# Patient Record
Sex: Female | Born: 1950
Health system: Southern US, Community
[De-identification: ages and names within clinical notes are randomized; demographics above are authoritative.]

## PROBLEM LIST (undated history)

## (undated) DIAGNOSIS — E785 Hyperlipidemia, unspecified: Secondary | ICD-10-CM

## (undated) DIAGNOSIS — H269 Unspecified cataract: Secondary | ICD-10-CM

## (undated) HISTORY — DX: Hyperlipidemia, unspecified: E78.5

## (undated) HISTORY — DX: Unspecified cataract: H26.9

## (undated) HISTORY — PX: CATARACT EXTRACTION: SUR2

---

## 2007-07-17 ENCOUNTER — Emergency Department (HOSPITAL_COMMUNITY): Admission: EM | Admit: 2007-07-17 | Discharge: 2007-07-17 | Payer: Self-pay | Admitting: Emergency Medicine

## 2007-12-22 ENCOUNTER — Encounter: Admission: RE | Admit: 2007-12-22 | Discharge: 2007-12-22 | Payer: Self-pay | Admitting: Internal Medicine

## 2008-01-01 ENCOUNTER — Ambulatory Visit: Payer: Self-pay | Admitting: Internal Medicine

## 2008-03-19 ENCOUNTER — Ambulatory Visit: Payer: Self-pay | Admitting: Internal Medicine

## 2008-03-26 ENCOUNTER — Encounter: Payer: Self-pay | Admitting: Internal Medicine

## 2008-03-26 ENCOUNTER — Ambulatory Visit: Payer: Self-pay | Admitting: Internal Medicine

## 2008-03-29 ENCOUNTER — Encounter: Payer: Self-pay | Admitting: Internal Medicine

## 2008-12-22 ENCOUNTER — Encounter: Admission: RE | Admit: 2008-12-22 | Discharge: 2008-12-22 | Payer: Self-pay | Admitting: Internal Medicine

## 2009-12-28 ENCOUNTER — Encounter: Admission: RE | Admit: 2009-12-28 | Discharge: 2009-12-28 | Payer: Self-pay | Admitting: Internal Medicine

## 2010-04-05 ENCOUNTER — Encounter: Admission: RE | Admit: 2010-04-05 | Discharge: 2010-04-05 | Payer: Self-pay | Admitting: Internal Medicine

## 2010-11-05 ENCOUNTER — Encounter: Payer: Self-pay | Admitting: Internal Medicine

## 2010-12-15 ENCOUNTER — Other Ambulatory Visit: Payer: Self-pay | Admitting: Internal Medicine

## 2010-12-15 DIAGNOSIS — Z1231 Encounter for screening mammogram for malignant neoplasm of breast: Secondary | ICD-10-CM

## 2011-01-04 ENCOUNTER — Ambulatory Visit
Admission: RE | Admit: 2011-01-04 | Discharge: 2011-01-04 | Disposition: A | Discharge status: Home / Self Care | Payer: 59 | Source: Ambulatory Visit | Attending: Internal Medicine | Admitting: Internal Medicine

## 2011-01-04 DIAGNOSIS — Z1231 Encounter for screening mammogram for malignant neoplasm of breast: Secondary | ICD-10-CM

## 2011-04-15 HISTORY — PX: CATARACT EXTRACTION: SUR2

## 2011-07-26 LAB — COMPREHENSIVE METABOLIC PANEL
AST: 22
Alkaline Phosphatase: 67
BUN: 12
CO2: 22
GFR calc non Af Amer: >60
Total Bilirubin: 0.6

## 2011-07-26 LAB — POCT CARDIAC MARKERS
CKMB, poc: <1 — ABNORMAL LOW
CKMB, poc: <1 — ABNORMAL LOW
Operator id: 277751
Operator id: 294501
Troponin i, poc: <0.05
Troponin i, poc: <0.05

## 2011-07-26 LAB — CULTURE, BLOOD (ROUTINE X 2)

## 2011-07-26 LAB — DIFFERENTIAL
Basophils Absolute: 0
Eosinophils Relative: 0
Lymphocytes Relative: 5 — ABNORMAL LOW
Monocytes Absolute: 0.2
Monocytes Relative: 3

## 2011-07-26 LAB — CBC
Hemoglobin: 14.5
RDW: 13.3
WBC: 8.1

## 2011-07-26 LAB — URINE MICROSCOPIC-ADD ON

## 2011-07-26 LAB — URINALYSIS, ROUTINE W REFLEX MICROSCOPIC
Bilirubin Urine: NEGATIVE
Glucose, UA: NEGATIVE
Ketones, ur: NEGATIVE
Protein, ur: NEGATIVE
Specific Gravity, Urine: 1.029

## 2011-07-26 LAB — URINE CULTURE
Colony Count: NO GROWTH
Culture: NO GROWTH

## 2011-11-27 ENCOUNTER — Other Ambulatory Visit: Payer: Self-pay | Admitting: Internal Medicine

## 2011-11-27 DIAGNOSIS — Z1231 Encounter for screening mammogram for malignant neoplasm of breast: Secondary | ICD-10-CM

## 2012-01-07 ENCOUNTER — Ambulatory Visit
Admission: RE | Admit: 2012-01-07 | Discharge: 2012-01-07 | Disposition: A | Discharge status: Home / Self Care | Payer: 59 | Source: Ambulatory Visit | Attending: Internal Medicine | Admitting: Internal Medicine

## 2012-01-07 DIAGNOSIS — Z1231 Encounter for screening mammogram for malignant neoplasm of breast: Secondary | ICD-10-CM

## 2012-04-09 DIAGNOSIS — F32A Depression, unspecified: Secondary | ICD-10-CM | POA: Insufficient documentation

## 2012-04-09 DIAGNOSIS — H5462 Unqualified visual loss, left eye, normal vision right eye: Secondary | ICD-10-CM | POA: Insufficient documentation

## 2012-04-09 DIAGNOSIS — E559 Vitamin D deficiency, unspecified: Secondary | ICD-10-CM | POA: Insufficient documentation

## 2012-04-09 DIAGNOSIS — E785 Hyperlipidemia, unspecified: Secondary | ICD-10-CM | POA: Insufficient documentation

## 2012-04-10 HISTORY — PX: OTHER SURGICAL HISTORY: SHX169

## 2012-06-11 DIAGNOSIS — E893 Postprocedural hypopituitarism: Secondary | ICD-10-CM | POA: Insufficient documentation

## 2012-12-11 ENCOUNTER — Other Ambulatory Visit: Payer: Self-pay

## 2012-12-11 DIAGNOSIS — Z1231 Encounter for screening mammogram for malignant neoplasm of breast: Secondary | ICD-10-CM

## 2013-01-13 ENCOUNTER — Ambulatory Visit
Admission: RE | Admit: 2013-01-13 | Discharge: 2013-01-13 | Disposition: A | Discharge status: Home / Self Care | Payer: 59 | Source: Ambulatory Visit

## 2013-01-13 DIAGNOSIS — Z1231 Encounter for screening mammogram for malignant neoplasm of breast: Secondary | ICD-10-CM

## 2013-03-17 ENCOUNTER — Encounter: Payer: Self-pay | Admitting: Internal Medicine

## 2013-04-13 ENCOUNTER — Ambulatory Visit (AMBULATORY_SURGERY_CENTER): Payer: 59 | Admitting: *Deleted

## 2013-04-13 ENCOUNTER — Encounter: Payer: Self-pay | Admitting: Internal Medicine

## 2013-04-13 VITALS — Ht 68.0 in | Wt 176.4 lb

## 2013-04-13 DIAGNOSIS — Z1211 Encounter for screening for malignant neoplasm of colon: Secondary | ICD-10-CM

## 2013-04-13 MED ORDER — MOVIPREP 100 G PO SOLR: ORAL | Status: DC

## 2013-04-29 DIAGNOSIS — D352 Benign neoplasm of pituitary gland: Secondary | ICD-10-CM | POA: Insufficient documentation

## 2013-05-05 ENCOUNTER — Ambulatory Visit (AMBULATORY_SURGERY_CENTER): Payer: 59 | Admitting: Internal Medicine

## 2013-05-05 ENCOUNTER — Encounter: Payer: Self-pay | Admitting: Internal Medicine

## 2013-05-05 VITALS — BP 130/87 | HR 69 | Temp 97.0°F | Resp 19 | Ht 68.0 in | Wt 176.0 lb

## 2013-05-05 DIAGNOSIS — Z86010 Personal history of colonic polyps: Secondary | ICD-10-CM

## 2013-05-05 DIAGNOSIS — D126 Benign neoplasm of colon, unspecified: Secondary | ICD-10-CM

## 2013-05-05 MED ORDER — SODIUM CHLORIDE 0.9 % IV SOLN: 500.0000 mL | INTRAVENOUS | Status: DC

## 2013-05-05 NOTE — Progress Notes (Signed)
Patient did not experience any of the following events: a burn prior to discharge; a fall within the facility; wrong site/side/patient/procedure/implant event; or a hospital transfer or hospital admission upon discharge from the facility. (G8907) Patient did not have preoperative order for IV antibiotic SSI prophylaxis. (G8918)  

## 2013-05-05 NOTE — Op Note (Signed)
[facility] [address] [place], [address]   COLONOSCOPY PROCEDURE REPORT  PATIENT: Erin Gregory, Erin Gregory  MR#: [id number] BIRTHDATE: 1950-12-02 , 62  yrs. old GENDER: Female ENDOSCOPIST: Roxy Cedar, MD REFERRED XB:[id number] Program Recall PROCEDURE DATE:  05/05/2013 PROCEDURE:   Colonoscopy with snare polypectomy x 1 ASA CLASS:   Class II INDICATIONS:Patient's personal history of adenomatous colon polyps. Index exam 03-2008 (small TAs) MEDICATIONS: MAC sedation, administered by CRNA and propofol (Diprivan) 250mg  IV  DESCRIPTION OF PROCEDURE:   After the risks benefits and alternatives of the procedure were thoroughly explained, informed consent was obtained.  A digital rectal exam revealed no abnormalities of the rectum.   The LB [id number]  endoscope was introduced through the anus and advanced to the cecum, which was identified by both the appendix and ileocecal valve. No adverse events experienced.   The quality of the prep was excellent, using MoviPrep  The instrument was then slowly withdrawn as the colon was fully examined.      COLON FINDINGS: A diminutive polyp was found in the ascending colon. A polypectomy was performed with a cold snare.  The resection was complete and the polyp tissue was completely retrieved.   Moderate diverticulosis was noted The finding was in the right colon and The finding was left colon.   The colon mucosa was otherwise normal. Retroflexed views revealed internal hemorrhoids. The time to cecum=1 minutes 47 seconds.  Withdrawal time=9 minutes 19 seconds. The scope was withdrawn and the procedure completed. COMPLICATIONS: There were no complications.  ENDOSCOPIC IMPRESSION: 1.   Diminutive polyp was found in the ascending colon; polypectomy was performed with a cold snare 2.   Moderate diverticulosis was noted in the right colon and left colon 3.   The colon mucosa was otherwise  normal  RECOMMENDATIONS: 1. Follow up colonoscopy in 5 years   eSigned:  Roxy Cedar, MD 05/05/2013 1:02 PM   cc: Kari Baars, MD and The Patient   PATIENT NAME:  Erin Gregory, Erin Gregory MR#: [id number]

## 2013-05-05 NOTE — Progress Notes (Signed)
A/ox3 pleased with MAC report to RN

## 2013-05-05 NOTE — Patient Instructions (Addendum)
YOU HAD AN ENDOSCOPIC PROCEDURE TODAY AT THE [facility]: Refer to the procedure report that was given to you for any specific questions about what was found during the examination.  If the procedure report does not answer your questions, please call your gastroenterologist to clarify.  If you requested that your care partner not be given the details of your procedure findings, then the procedure report has been included in a sealed envelope for you to review at your convenience later.  YOU SHOULD EXPECT: Some feelings of bloating in the abdomen. Passage of more gas than usual.  Walking can help get rid of the air that was put into your GI tract during the procedure and reduce the bloating. If you had a lower endoscopy (such as a colonoscopy or flexible sigmoidoscopy) you may notice spotting of blood in your stool or on the toilet paper. If you underwent a bowel prep for your procedure, then you may not have a normal bowel movement for a few days.  DIET: Your first meal following the procedure should be a light meal and then it is ok to progress to your normal diet.  A half-sandwich or bowl of soup is an example of a good first meal.  Heavy or fried foods are harder to digest and may make you feel nauseous or bloated.  Likewise meals heavy in dairy and vegetables can cause extra gas to form and this can also increase the bloating.  Drink plenty of fluids but you should avoid alcoholic beverages for 24 hours.  ACTIVITY: Your care partner should take you home directly after the procedure.  You should plan to take it easy, moving slowly for the rest of the day.  You can resume normal activity the day after the procedure however you should NOT DRIVE or use heavy machinery for 24 hours (because of the sedation medicines used during the test).    SYMPTOMS TO REPORT IMMEDIATELY: A gastroenterologist can be reached at any hour.  During normal business hours, 8:30 AM to 5:00 PM Monday through Friday,  call [phone].  After hours and on weekends, please call the GI answering service at [phone] who will take a message and have the physician on call contact you.   Following lower endoscopy (colonoscopy or flexible sigmoidoscopy):  Excessive amounts of blood in the stool  Significant tenderness or worsening of abdominal pains  Swelling of the abdomen that is new, acute  Fever of 100F or higher  Following upper endoscopy (EGD)   FOLLOW UP: If any biopsies were taken you will be contacted by phone or by letter within the next 1-3 weeks.  Call your gastroenterologist if you have not heard about the biopsies in 3 weeks.  Our staff will call the home number listed on your records the next business day following your procedure to check on you and address any questions or concerns that you may have at that time regarding the information given to you following your procedure. This is a courtesy call and so if there is no answer at the home number and we have not heard from you through the emergency physician on call, we will assume that you have returned to your regular daily activities without incident.  SIGNATURES/CONFIDENTIALITY: You and/or your care partner have signed paperwork which will be entered into your electronic medical record.  These signatures attest to the fact that that the information above on your After Visit Summary has been reviewed and is understood.  Full  responsibility of the confidentiality of this discharge information lies with you and/or your care-partner.  Polyp, diverticulosis, high fiber diet information given.   Next colonoscopy due 5 years-2019

## 2013-05-05 NOTE — Progress Notes (Signed)
Called to room to assist during endoscopic procedure.  Patient ID and intended procedure confirmed with present staff. Received instructions for my participation in the procedure from the performing physician.  

## 2013-05-06 ENCOUNTER — Telehealth: Payer: Self-pay | Admitting: *Deleted

## 2013-05-06 NOTE — Telephone Encounter (Signed)
  Follow up Call-  Call back number 05/05/2013  Post procedure Call Back phone  # [phone]  Permission to leave phone message Yes     Patient questions:  Do you have a fever, pain , or abdominal swelling? no Pain Score  0 *  Have you tolerated food without any problems? yes  Have you been able to return to your normal activities? yes  Do you have any questions about your discharge instructions: Diet   no Medications  no Follow up visit  no  Do you have questions or concerns about your Care? no  Actions: * If pain score is 4 or above: No action needed, pain <4.

## 2013-05-11 ENCOUNTER — Encounter: Payer: Self-pay | Admitting: Internal Medicine

## 2013-12-14 ENCOUNTER — Other Ambulatory Visit: Payer: Self-pay

## 2013-12-14 DIAGNOSIS — Z1231 Encounter for screening mammogram for malignant neoplasm of breast: Secondary | ICD-10-CM

## 2014-01-26 ENCOUNTER — Ambulatory Visit: Payer: 59

## 2014-02-16 ENCOUNTER — Encounter (INDEPENDENT_AMBULATORY_CARE_PROVIDER_SITE_OTHER): Payer: Self-pay

## 2014-02-16 ENCOUNTER — Ambulatory Visit
Admission: RE | Admit: 2014-02-16 | Discharge: 2014-02-16 | Disposition: A | Discharge status: Home / Self Care | Payer: 59 | Source: Ambulatory Visit

## 2014-02-16 DIAGNOSIS — Z1231 Encounter for screening mammogram for malignant neoplasm of breast: Secondary | ICD-10-CM

## 2014-06-11 ENCOUNTER — Encounter: Payer: Self-pay | Admitting: Internal Medicine

## 2015-01-17 ENCOUNTER — Other Ambulatory Visit: Payer: Self-pay

## 2015-01-17 DIAGNOSIS — Z1231 Encounter for screening mammogram for malignant neoplasm of breast: Secondary | ICD-10-CM

## 2015-02-22 ENCOUNTER — Ambulatory Visit: Payer: Self-pay

## 2015-03-01 ENCOUNTER — Ambulatory Visit
Admission: RE | Admit: 2015-03-01 | Discharge: 2015-03-01 | Disposition: A | Discharge status: Home / Self Care | Payer: Medicare Other | Source: Ambulatory Visit

## 2015-03-01 DIAGNOSIS — Z1231 Encounter for screening mammogram for malignant neoplasm of breast: Secondary | ICD-10-CM

## 2016-01-23 ENCOUNTER — Other Ambulatory Visit: Payer: Self-pay

## 2016-01-23 DIAGNOSIS — Z1231 Encounter for screening mammogram for malignant neoplasm of breast: Secondary | ICD-10-CM

## 2016-03-01 ENCOUNTER — Ambulatory Visit
Admission: RE | Admit: 2016-03-01 | Discharge: 2016-03-01 | Disposition: A | Discharge status: Home / Self Care | Payer: Medicare Other | Source: Ambulatory Visit

## 2016-03-01 DIAGNOSIS — Z1231 Encounter for screening mammogram for malignant neoplasm of breast: Secondary | ICD-10-CM

## 2017-01-25 ENCOUNTER — Other Ambulatory Visit: Payer: Self-pay | Admitting: Internal Medicine

## 2017-01-25 DIAGNOSIS — Z1231 Encounter for screening mammogram for malignant neoplasm of breast: Secondary | ICD-10-CM

## 2017-01-29 DIAGNOSIS — H1013 Acute atopic conjunctivitis, bilateral: Secondary | ICD-10-CM | POA: Diagnosis not present

## 2017-01-29 DIAGNOSIS — H40013 Open angle with borderline findings, low risk, bilateral: Secondary | ICD-10-CM | POA: Diagnosis not present

## 2017-03-05 ENCOUNTER — Ambulatory Visit
Admission: RE | Admit: 2017-03-05 | Discharge: 2017-03-05 | Disposition: A | Discharge status: Home / Self Care | Payer: Medicare HMO | Source: Ambulatory Visit | Attending: Internal Medicine | Admitting: Internal Medicine

## 2017-03-05 DIAGNOSIS — Z1231 Encounter for screening mammogram for malignant neoplasm of breast: Secondary | ICD-10-CM | POA: Diagnosis not present

## 2017-04-04 DIAGNOSIS — Z0000 Encounter for general adult medical examination without abnormal findings: Secondary | ICD-10-CM | POA: Diagnosis not present

## 2017-04-04 DIAGNOSIS — M859 Disorder of bone density and structure, unspecified: Secondary | ICD-10-CM | POA: Diagnosis not present

## 2017-04-04 DIAGNOSIS — E784 Other hyperlipidemia: Secondary | ICD-10-CM | POA: Diagnosis not present

## 2017-04-11 DIAGNOSIS — R030 Elevated blood-pressure reading, without diagnosis of hypertension: Secondary | ICD-10-CM | POA: Diagnosis not present

## 2017-04-11 DIAGNOSIS — Z0000 Encounter for general adult medical examination without abnormal findings: Secondary | ICD-10-CM | POA: Diagnosis not present

## 2017-04-11 DIAGNOSIS — R87615 Unsatisfactory cytologic smear of cervix: Secondary | ICD-10-CM | POA: Diagnosis not present

## 2017-04-11 DIAGNOSIS — Z6826 Body mass index (BMI) 26.0-26.9, adult: Secondary | ICD-10-CM | POA: Diagnosis not present

## 2017-04-11 DIAGNOSIS — M859 Disorder of bone density and structure, unspecified: Secondary | ICD-10-CM | POA: Diagnosis not present

## 2017-04-11 DIAGNOSIS — Z1212 Encounter for screening for malignant neoplasm of rectum: Secondary | ICD-10-CM | POA: Diagnosis not present

## 2017-04-11 DIAGNOSIS — E784 Other hyperlipidemia: Secondary | ICD-10-CM | POA: Diagnosis not present

## 2017-04-11 DIAGNOSIS — E221 Hyperprolactinemia: Secondary | ICD-10-CM | POA: Diagnosis not present

## 2017-04-11 DIAGNOSIS — D126 Benign neoplasm of colon, unspecified: Secondary | ICD-10-CM | POA: Diagnosis not present

## 2017-04-11 DIAGNOSIS — Z1389 Encounter for screening for other disorder: Secondary | ICD-10-CM | POA: Diagnosis not present

## 2017-04-11 DIAGNOSIS — Z124 Encounter for screening for malignant neoplasm of cervix: Secondary | ICD-10-CM | POA: Diagnosis not present

## 2017-04-11 DIAGNOSIS — D352 Benign neoplasm of pituitary gland: Secondary | ICD-10-CM | POA: Diagnosis not present

## 2017-05-09 DIAGNOSIS — M859 Disorder of bone density and structure, unspecified: Secondary | ICD-10-CM | POA: Diagnosis not present

## 2017-05-31 DIAGNOSIS — O926 Galactorrhea: Secondary | ICD-10-CM | POA: Diagnosis not present

## 2017-05-31 DIAGNOSIS — D352 Benign neoplasm of pituitary gland: Secondary | ICD-10-CM | POA: Diagnosis not present

## 2017-05-31 DIAGNOSIS — M8580 Other specified disorders of bone density and structure, unspecified site: Secondary | ICD-10-CM | POA: Diagnosis not present

## 2017-05-31 DIAGNOSIS — Z6827 Body mass index (BMI) 27.0-27.9, adult: Secondary | ICD-10-CM | POA: Diagnosis not present

## 2017-07-12 DIAGNOSIS — R69 Illness, unspecified: Secondary | ICD-10-CM | POA: Diagnosis not present

## 2017-08-01 DIAGNOSIS — H1013 Acute atopic conjunctivitis, bilateral: Secondary | ICD-10-CM | POA: Diagnosis not present

## 2017-08-01 DIAGNOSIS — H35371 Puckering of macula, right eye: Secondary | ICD-10-CM | POA: Diagnosis not present

## 2017-08-01 DIAGNOSIS — Z961 Presence of intraocular lens: Secondary | ICD-10-CM | POA: Diagnosis not present

## 2017-08-01 DIAGNOSIS — H40013 Open angle with borderline findings, low risk, bilateral: Secondary | ICD-10-CM | POA: Diagnosis not present

## 2017-10-15 HISTORY — PX: COLONOSCOPY: SHX174

## 2018-02-03 ENCOUNTER — Other Ambulatory Visit: Payer: Self-pay | Admitting: Internal Medicine

## 2018-02-03 DIAGNOSIS — Z139 Encounter for screening, unspecified: Secondary | ICD-10-CM

## 2018-02-03 DIAGNOSIS — H40013 Open angle with borderline findings, low risk, bilateral: Secondary | ICD-10-CM | POA: Diagnosis not present

## 2018-03-05 ENCOUNTER — Encounter: Payer: Self-pay | Admitting: Internal Medicine

## 2018-03-06 ENCOUNTER — Ambulatory Visit
Admission: RE | Admit: 2018-03-06 | Discharge: 2018-03-06 | Disposition: A | Discharge status: Home / Self Care | Payer: Medicare HMO | Source: Ambulatory Visit | Attending: Internal Medicine | Admitting: Internal Medicine

## 2018-03-06 DIAGNOSIS — Z1231 Encounter for screening mammogram for malignant neoplasm of breast: Secondary | ICD-10-CM | POA: Diagnosis not present

## 2018-03-06 DIAGNOSIS — Z139 Encounter for screening, unspecified: Secondary | ICD-10-CM

## 2018-03-12 ENCOUNTER — Encounter: Payer: Self-pay | Admitting: Internal Medicine

## 2018-03-13 DIAGNOSIS — R58 Hemorrhage, not elsewhere classified: Secondary | ICD-10-CM | POA: Diagnosis not present

## 2018-03-13 DIAGNOSIS — Z6828 Body mass index (BMI) 28.0-28.9, adult: Secondary | ICD-10-CM | POA: Diagnosis not present

## 2018-04-18 DIAGNOSIS — M859 Disorder of bone density and structure, unspecified: Secondary | ICD-10-CM | POA: Diagnosis not present

## 2018-04-18 DIAGNOSIS — R82998 Other abnormal findings in urine: Secondary | ICD-10-CM | POA: Diagnosis not present

## 2018-04-18 DIAGNOSIS — E7849 Other hyperlipidemia: Secondary | ICD-10-CM | POA: Diagnosis not present

## 2018-04-21 DIAGNOSIS — D352 Benign neoplasm of pituitary gland: Secondary | ICD-10-CM | POA: Diagnosis not present

## 2018-04-24 DIAGNOSIS — D352 Benign neoplasm of pituitary gland: Secondary | ICD-10-CM | POA: Diagnosis not present

## 2018-04-24 DIAGNOSIS — L259 Unspecified contact dermatitis, unspecified cause: Secondary | ICD-10-CM | POA: Diagnosis not present

## 2018-04-24 DIAGNOSIS — Z1389 Encounter for screening for other disorder: Secondary | ICD-10-CM | POA: Diagnosis not present

## 2018-04-24 DIAGNOSIS — D126 Benign neoplasm of colon, unspecified: Secondary | ICD-10-CM | POA: Diagnosis not present

## 2018-04-24 DIAGNOSIS — Z0000 Encounter for general adult medical examination without abnormal findings: Secondary | ICD-10-CM | POA: Diagnosis not present

## 2018-04-24 DIAGNOSIS — R030 Elevated blood-pressure reading, without diagnosis of hypertension: Secondary | ICD-10-CM | POA: Diagnosis not present

## 2018-04-24 DIAGNOSIS — E7849 Other hyperlipidemia: Secondary | ICD-10-CM | POA: Diagnosis not present

## 2018-04-24 DIAGNOSIS — Z6828 Body mass index (BMI) 28.0-28.9, adult: Secondary | ICD-10-CM | POA: Diagnosis not present

## 2018-04-24 DIAGNOSIS — E221 Hyperprolactinemia: Secondary | ICD-10-CM | POA: Diagnosis not present

## 2018-04-24 DIAGNOSIS — M8580 Other specified disorders of bone density and structure, unspecified site: Secondary | ICD-10-CM | POA: Diagnosis not present

## 2018-05-20 ENCOUNTER — Ambulatory Visit (AMBULATORY_SURGERY_CENTER): Payer: Self-pay | Admitting: *Deleted

## 2018-05-20 VITALS — Ht 67.0 in | Wt 178.6 lb

## 2018-05-20 DIAGNOSIS — Z86010 Personal history of colonic polyps: Secondary | ICD-10-CM

## 2018-05-20 MED ORDER — PLENVU 140 G PO SOLR: 1.0000 | Freq: Once | ORAL | 0 refills | Status: AC

## 2018-05-20 NOTE — Progress Notes (Signed)
No egg or soy allergy known to patient  No issues with past sedation with any surgeries  or procedures, no intubation problems  No diet pills per patient No home 02 use per patient  No blood thinners per patient  Pt denies issues with constipation  No A fib or A flutter  EMMI video sent to pt's e mail  

## 2018-06-03 ENCOUNTER — Ambulatory Visit (AMBULATORY_SURGERY_CENTER): Payer: Medicare HMO | Admitting: Internal Medicine

## 2018-06-03 ENCOUNTER — Encounter: Payer: Self-pay | Admitting: Internal Medicine

## 2018-06-03 VITALS — BP 113/67 | HR 71 | Temp 97.8°F | Resp 16 | Ht 67.0 in | Wt 178.0 lb

## 2018-06-03 DIAGNOSIS — E785 Hyperlipidemia, unspecified: Secondary | ICD-10-CM | POA: Diagnosis not present

## 2018-06-03 DIAGNOSIS — Z86010 Personal history of colonic polyps: Secondary | ICD-10-CM

## 2018-06-03 DIAGNOSIS — E669 Obesity, unspecified: Secondary | ICD-10-CM | POA: Diagnosis not present

## 2018-06-03 DIAGNOSIS — D125 Benign neoplasm of sigmoid colon: Secondary | ICD-10-CM | POA: Diagnosis not present

## 2018-06-03 DIAGNOSIS — K635 Polyp of colon: Secondary | ICD-10-CM

## 2018-06-03 DIAGNOSIS — Z1211 Encounter for screening for malignant neoplasm of colon: Secondary | ICD-10-CM | POA: Diagnosis not present

## 2018-06-03 MED ORDER — SODIUM CHLORIDE 0.9 % IV SOLN: 500.0000 mL | Freq: Once | INTRAVENOUS | Status: DC

## 2018-06-03 NOTE — Op Note (Signed)
[place] Patient Name: Erin Gregory Procedure Date: 06/03/2018 11:07 AM MRN: [id number] Endoscopist: Docia Chuck. Henrene Pastor , MD Age: 67 Referring MD:  Date of Birth: [birth year] Gender: Female Account #: [id number] Procedure:                Colonoscopy, with cold snare polypectomy x 1 Indications:              High risk colon cancer surveillance: Personal                            history of multiple (3 or more) adenomas. Previous                            examinations 2009 and 2014 Medicines:                Monitored Anesthesia Care Procedure:                Pre-Anesthesia Assessment:                           - Prior to the procedure, a History and Physical                            was performed, and patient medications and                            allergies were reviewed. The patient's tolerance of                            previous anesthesia was also reviewed. The risks                            and benefits of the procedure and the sedation                            options and risks were discussed with the patient.                            All questions were answered, and informed consent                            was obtained. Prior Anticoagulants: The patient has                            taken no previous anticoagulant or antiplatelet                            agents. ASA Grade Assessment: I - A normal, healthy                            patient. After reviewing the risks and benefits,                            the patient was deemed in satisfactory condition to  undergo the procedure.                           After obtaining informed consent, the colonoscope                            was passed under direct vision. Throughout the                            procedure, the patient's blood pressure, pulse, and                            oxygen saturations were monitored continuously. The                            Model  [id number]) scope was introduced                            through the anus and advanced to the the cecum,                            identified by appendiceal orifice and ileocecal                            valve. The ileocecal valve, appendiceal orifice,                            and rectum were photographed. The quality of the                            bowel preparation was excellent. The colonoscopy                            was performed without difficulty. The patient                            tolerated the procedure well. The bowel preparation                            used was SUPREP. Scope In: 11:17:48 AM Scope Out: 11:28:42 AM Scope Withdrawal Time: 0 hours 8 minutes 37 seconds  Total Procedure Duration: 0 hours 10 minutes 54 seconds  Findings:                 A 6 mm polyp was found in the sigmoid colon. The                            polyp was pedunculated. The polyp was removed with                            a cold snare. Resection and retrieval were complete.                           Multiple diverticula were found in the left colon  and right colon.                           Internal hemorrhoids were found during retroflexion.                           The exam was otherwise without abnormality on                            direct and retroflexion views. Complications:            No immediate complications. Estimated blood loss:                            None. Estimated Blood Loss:     Estimated blood loss: none. Impression:               - One 6 mm polyp in the sigmoid colon, removed with                            a cold snare. Resected and retrieved.                           - Diverticulosis in the left colon and in the right                            colon.                           - Internal hemorrhoids.                           - The examination was otherwise normal on direct                            and  retroflexion views. Recommendation:           - Repeat colonoscopy in 5 years for surveillance.                           - Patient has a contact number available for                            emergencies. The signs and symptoms of potential                            delayed complications were discussed with the                            patient. Return to normal activities tomorrow.                            Written discharge instructions were provided to the                            patient.                           -  Resume previous diet.                           - Continue present medications.                           - Await pathology results. Docia Chuck. Henrene Pastor, MD 06/03/2018 11:34:08 AM This report has been signed electronically.

## 2018-06-03 NOTE — Patient Instructions (Signed)
Please read handouts on Polyps, Diverticulosis, and Hemorrhoids. Continue present medications.     YOU HAD AN ENDOSCOPIC PROCEDURE TODAY AT [facility]:   Refer to the procedure report that was given to you for any specific questions about what was found during the examination.  If the procedure report does not answer your questions, please call your gastroenterologist to clarify.  If you requested that your care partner not be given the details of your procedure findings, then the procedure report has been included in a sealed envelope for you to review at your convenience later.  YOU SHOULD EXPECT: Some feelings of bloating in the abdomen. Passage of more gas than usual.  Walking can help get rid of the air that was put into your GI tract during the procedure and reduce the bloating. If you had a lower endoscopy (such as a colonoscopy or flexible sigmoidoscopy) you may notice spotting of blood in your stool or on the toilet paper. If you underwent a bowel prep for your procedure, you may not have a normal bowel movement for a few days.  Please Note:  You might notice some irritation and congestion in your nose or some drainage.  This is from the oxygen used during your procedure.  There is no need for concern and it should clear up in a day or so.  SYMPTOMS TO REPORT IMMEDIATELY:   Following lower endoscopy (colonoscopy or flexible sigmoidoscopy):  Excessive amounts of blood in the stool  Significant tenderness or worsening of abdominal pains  Swelling of the abdomen that is new, acute  Fever of 100F or higher    For urgent or emergent issues, a gastroenterologist can be reached at any hour by calling [phone].   DIET:  We do recommend a small meal at first, but then you may proceed to your regular diet.  Drink plenty of fluids but you should avoid alcoholic beverages for 24 hours.  ACTIVITY:  You should plan to take it easy for the rest of today and you should  NOT DRIVE or use heavy machinery until tomorrow (because of the sedation medicines used during the test).    FOLLOW UP: Our staff will call the number listed on your records the next business day following your procedure to check on you and address any questions or concerns that you may have regarding the information given to you following your procedure. If we do not reach you, we will leave a message.  However, if you are feeling well and you are not experiencing any problems, there is no need to return our call.  We will assume that you have returned to your regular daily activities without incident.  If any biopsies were taken you will be contacted by phone or by letter within the next 1-3 weeks.  Please call us at [phone] if you have not heard about the biopsies in 3 weeks.    SIGNATURES/CONFIDENTIALITY: You and/or your care partner have signed paperwork which will be entered into your electronic medical record.  These signatures attest to the fact that that the information above on your After Visit Summary has been reviewed and is understood.  Full responsibility of the confidentiality of this discharge information lies with you and/or your care-partner.

## 2018-06-03 NOTE — Progress Notes (Signed)
Called to room to assist during endoscopic procedure.  Patient ID and intended procedure confirmed with present staff. Received instructions for my participation in the procedure from the performing physician.  

## 2018-06-03 NOTE — Progress Notes (Signed)
Report to PACU, RN, vss, BBS= Clear.  

## 2018-06-04 ENCOUNTER — Telehealth: Payer: Self-pay

## 2018-06-04 NOTE — Telephone Encounter (Signed)
  Follow up Call-  Call back number 06/03/2018  Post procedure Call Back phone  # [phone]  Permission to leave phone message Yes  Some recent data might be hidden     Patient questions:  Do you have a fever, pain , or abdominal swelling? No. Pain Score  0 *  Have you tolerated food without any problems? Yes.    Have you been able to return to your normal activities? Yes.    Do you have any questions about your discharge instructions: Diet   No. Medications  No. Follow up visit  No.  Do you have questions or concerns about your Care? No.  Actions: * If pain score is 4 or above: No action needed, pain <4.

## 2018-06-06 ENCOUNTER — Encounter: Payer: Self-pay | Admitting: Internal Medicine

## 2018-06-09 DIAGNOSIS — M8580 Other specified disorders of bone density and structure, unspecified site: Secondary | ICD-10-CM | POA: Diagnosis not present

## 2018-06-09 DIAGNOSIS — D352 Benign neoplasm of pituitary gland: Secondary | ICD-10-CM | POA: Diagnosis not present

## 2018-06-09 DIAGNOSIS — D126 Benign neoplasm of colon, unspecified: Secondary | ICD-10-CM | POA: Diagnosis not present

## 2018-06-09 DIAGNOSIS — E7849 Other hyperlipidemia: Secondary | ICD-10-CM | POA: Diagnosis not present

## 2018-07-17 DIAGNOSIS — R69 Illness, unspecified: Secondary | ICD-10-CM | POA: Diagnosis not present

## 2018-08-14 DIAGNOSIS — H1013 Acute atopic conjunctivitis, bilateral: Secondary | ICD-10-CM | POA: Diagnosis not present

## 2018-08-14 DIAGNOSIS — Z961 Presence of intraocular lens: Secondary | ICD-10-CM | POA: Diagnosis not present

## 2018-08-14 DIAGNOSIS — H40013 Open angle with borderline findings, low risk, bilateral: Secondary | ICD-10-CM | POA: Diagnosis not present

## 2018-08-14 DIAGNOSIS — H35371 Puckering of macula, right eye: Secondary | ICD-10-CM | POA: Diagnosis not present

## 2019-02-27 ENCOUNTER — Other Ambulatory Visit: Payer: Self-pay | Admitting: Internal Medicine

## 2019-02-27 DIAGNOSIS — Z1231 Encounter for screening mammogram for malignant neoplasm of breast: Secondary | ICD-10-CM

## 2019-05-13 DIAGNOSIS — E221 Hyperprolactinemia: Secondary | ICD-10-CM | POA: Diagnosis not present

## 2019-05-13 DIAGNOSIS — M859 Disorder of bone density and structure, unspecified: Secondary | ICD-10-CM | POA: Diagnosis not present

## 2019-05-13 DIAGNOSIS — D352 Benign neoplasm of pituitary gland: Secondary | ICD-10-CM | POA: Diagnosis not present

## 2019-05-13 DIAGNOSIS — E7849 Other hyperlipidemia: Secondary | ICD-10-CM | POA: Diagnosis not present

## 2019-05-14 DIAGNOSIS — R82998 Other abnormal findings in urine: Secondary | ICD-10-CM | POA: Diagnosis not present

## 2019-05-20 DIAGNOSIS — E785 Hyperlipidemia, unspecified: Secondary | ICD-10-CM | POA: Diagnosis not present

## 2019-05-20 DIAGNOSIS — M8580 Other specified disorders of bone density and structure, unspecified site: Secondary | ICD-10-CM | POA: Diagnosis not present

## 2019-05-20 DIAGNOSIS — D352 Benign neoplasm of pituitary gland: Secondary | ICD-10-CM | POA: Diagnosis not present

## 2019-05-20 DIAGNOSIS — Z0000 Encounter for general adult medical examination without abnormal findings: Secondary | ICD-10-CM | POA: Diagnosis not present

## 2019-05-20 DIAGNOSIS — R030 Elevated blood-pressure reading, without diagnosis of hypertension: Secondary | ICD-10-CM | POA: Diagnosis not present

## 2019-06-05 ENCOUNTER — Other Ambulatory Visit: Payer: Self-pay | Admitting: Internal Medicine

## 2019-06-05 DIAGNOSIS — N644 Mastodynia: Secondary | ICD-10-CM

## 2019-06-15 ENCOUNTER — Ambulatory Visit: Payer: Medicare HMO

## 2019-06-15 ENCOUNTER — Ambulatory Visit
Admission: RE | Admit: 2019-06-15 | Discharge: 2019-06-15 | Disposition: A | Discharge status: Home / Self Care | Payer: Medicare HMO | Source: Ambulatory Visit | Attending: Internal Medicine | Admitting: Internal Medicine

## 2019-06-15 ENCOUNTER — Other Ambulatory Visit: Payer: Self-pay

## 2019-06-15 DIAGNOSIS — R928 Other abnormal and inconclusive findings on diagnostic imaging of breast: Secondary | ICD-10-CM | POA: Diagnosis not present

## 2019-06-15 DIAGNOSIS — N644 Mastodynia: Secondary | ICD-10-CM

## 2019-06-23 ENCOUNTER — Ambulatory Visit: Payer: Medicare HMO

## 2019-06-27 DIAGNOSIS — Z23 Encounter for immunization: Secondary | ICD-10-CM | POA: Diagnosis not present

## 2019-11-12 ENCOUNTER — Ambulatory Visit: Payer: Medicare HMO

## 2019-11-17 ENCOUNTER — Ambulatory Visit: Payer: Medicare HMO

## 2019-11-20 ENCOUNTER — Ambulatory Visit: Payer: Medicare HMO

## 2020-01-13 DIAGNOSIS — Z961 Presence of intraocular lens: Secondary | ICD-10-CM | POA: Diagnosis not present

## 2020-01-13 DIAGNOSIS — H40013 Open angle with borderline findings, low risk, bilateral: Secondary | ICD-10-CM | POA: Diagnosis not present

## 2020-01-13 DIAGNOSIS — D443 Neoplasm of uncertain behavior of pituitary gland: Secondary | ICD-10-CM | POA: Diagnosis not present

## 2020-01-13 DIAGNOSIS — H1045 Other chronic allergic conjunctivitis: Secondary | ICD-10-CM | POA: Diagnosis not present

## 2020-02-23 DIAGNOSIS — M25561 Pain in right knee: Secondary | ICD-10-CM | POA: Diagnosis not present

## 2020-02-26 DIAGNOSIS — M25561 Pain in right knee: Secondary | ICD-10-CM | POA: Diagnosis not present

## 2020-03-10 DIAGNOSIS — M545 Low back pain: Secondary | ICD-10-CM | POA: Diagnosis not present

## 2020-04-05 DIAGNOSIS — D443 Neoplasm of uncertain behavior of pituitary gland: Secondary | ICD-10-CM | POA: Diagnosis not present

## 2020-04-05 DIAGNOSIS — H40013 Open angle with borderline findings, low risk, bilateral: Secondary | ICD-10-CM | POA: Diagnosis not present

## 2020-04-20 ENCOUNTER — Other Ambulatory Visit: Payer: Self-pay | Admitting: Physician Assistant

## 2020-04-20 DIAGNOSIS — M25561 Pain in right knee: Secondary | ICD-10-CM

## 2020-05-13 ENCOUNTER — Other Ambulatory Visit: Payer: Self-pay | Admitting: Internal Medicine

## 2020-05-13 DIAGNOSIS — Z1231 Encounter for screening mammogram for malignant neoplasm of breast: Secondary | ICD-10-CM

## 2020-05-23 DIAGNOSIS — Z20822 Contact with and (suspected) exposure to covid-19: Secondary | ICD-10-CM | POA: Diagnosis not present

## 2020-05-27 DIAGNOSIS — E785 Hyperlipidemia, unspecified: Secondary | ICD-10-CM | POA: Diagnosis not present

## 2020-05-27 DIAGNOSIS — M8580 Other specified disorders of bone density and structure, unspecified site: Secondary | ICD-10-CM | POA: Diagnosis not present

## 2020-05-27 DIAGNOSIS — M859 Disorder of bone density and structure, unspecified: Secondary | ICD-10-CM | POA: Diagnosis not present

## 2020-06-03 DIAGNOSIS — E221 Hyperprolactinemia: Secondary | ICD-10-CM | POA: Diagnosis not present

## 2020-06-03 DIAGNOSIS — O926 Galactorrhea: Secondary | ICD-10-CM | POA: Diagnosis not present

## 2020-06-03 DIAGNOSIS — Z7689 Persons encountering health services in other specified circumstances: Secondary | ICD-10-CM | POA: Diagnosis not present

## 2020-06-03 DIAGNOSIS — D352 Benign neoplasm of pituitary gland: Secondary | ICD-10-CM | POA: Diagnosis not present

## 2020-06-03 DIAGNOSIS — Z0000 Encounter for general adult medical examination without abnormal findings: Secondary | ICD-10-CM | POA: Diagnosis not present

## 2020-06-03 DIAGNOSIS — M8580 Other specified disorders of bone density and structure, unspecified site: Secondary | ICD-10-CM | POA: Diagnosis not present

## 2020-06-03 DIAGNOSIS — Z1212 Encounter for screening for malignant neoplasm of rectum: Secondary | ICD-10-CM | POA: Diagnosis not present

## 2020-06-03 DIAGNOSIS — J309 Allergic rhinitis, unspecified: Secondary | ICD-10-CM | POA: Diagnosis not present

## 2020-06-03 DIAGNOSIS — Z86010 Personal history of colonic polyps: Secondary | ICD-10-CM | POA: Diagnosis not present

## 2020-06-03 DIAGNOSIS — M5416 Radiculopathy, lumbar region: Secondary | ICD-10-CM | POA: Diagnosis not present

## 2020-06-03 DIAGNOSIS — R82998 Other abnormal findings in urine: Secondary | ICD-10-CM | POA: Diagnosis not present

## 2020-06-03 DIAGNOSIS — E785 Hyperlipidemia, unspecified: Secondary | ICD-10-CM | POA: Diagnosis not present

## 2020-06-03 DIAGNOSIS — R030 Elevated blood-pressure reading, without diagnosis of hypertension: Secondary | ICD-10-CM | POA: Diagnosis not present

## 2020-06-15 ENCOUNTER — Ambulatory Visit
Admission: RE | Admit: 2020-06-15 | Discharge: 2020-06-15 | Disposition: A | Discharge status: Home / Self Care | Payer: Medicare HMO | Source: Ambulatory Visit | Attending: Internal Medicine | Admitting: Internal Medicine

## 2020-06-15 ENCOUNTER — Other Ambulatory Visit: Payer: Self-pay

## 2020-06-15 DIAGNOSIS — Z1231 Encounter for screening mammogram for malignant neoplasm of breast: Secondary | ICD-10-CM

## 2020-07-12 DIAGNOSIS — R69 Illness, unspecified: Secondary | ICD-10-CM | POA: Diagnosis not present

## 2020-10-10 DIAGNOSIS — Z20822 Contact with and (suspected) exposure to covid-19: Secondary | ICD-10-CM | POA: Diagnosis not present

## 2021-01-05 DIAGNOSIS — H524 Presbyopia: Secondary | ICD-10-CM | POA: Diagnosis not present

## 2021-01-05 DIAGNOSIS — H40013 Open angle with borderline findings, low risk, bilateral: Secondary | ICD-10-CM | POA: Diagnosis not present

## 2021-01-05 DIAGNOSIS — Z961 Presence of intraocular lens: Secondary | ICD-10-CM | POA: Diagnosis not present

## 2021-01-05 DIAGNOSIS — H26492 Other secondary cataract, left eye: Secondary | ICD-10-CM | POA: Diagnosis not present

## 2021-01-05 DIAGNOSIS — D443 Neoplasm of uncertain behavior of pituitary gland: Secondary | ICD-10-CM | POA: Diagnosis not present

## 2021-04-18 ENCOUNTER — Other Ambulatory Visit (HOSPITAL_COMMUNITY): Payer: Self-pay | Admitting: Physician Assistant

## 2021-04-18 DIAGNOSIS — M25561 Pain in right knee: Secondary | ICD-10-CM

## 2021-04-27 DIAGNOSIS — R42 Dizziness and giddiness: Secondary | ICD-10-CM | POA: Diagnosis not present

## 2021-04-27 DIAGNOSIS — R030 Elevated blood-pressure reading, without diagnosis of hypertension: Secondary | ICD-10-CM | POA: Diagnosis not present

## 2021-05-09 DIAGNOSIS — H8110 Benign paroxysmal vertigo, unspecified ear: Secondary | ICD-10-CM | POA: Diagnosis not present

## 2021-05-10 ENCOUNTER — Other Ambulatory Visit: Payer: Self-pay | Admitting: Internal Medicine

## 2021-05-10 DIAGNOSIS — Z1231 Encounter for screening mammogram for malignant neoplasm of breast: Secondary | ICD-10-CM

## 2021-06-15 DIAGNOSIS — M810 Age-related osteoporosis without current pathological fracture: Secondary | ICD-10-CM | POA: Diagnosis not present

## 2021-06-15 DIAGNOSIS — E785 Hyperlipidemia, unspecified: Secondary | ICD-10-CM | POA: Diagnosis not present

## 2021-06-15 DIAGNOSIS — M859 Disorder of bone density and structure, unspecified: Secondary | ICD-10-CM | POA: Diagnosis not present

## 2021-06-15 DIAGNOSIS — E221 Hyperprolactinemia: Secondary | ICD-10-CM | POA: Diagnosis not present

## 2021-06-22 DIAGNOSIS — H8110 Benign paroxysmal vertigo, unspecified ear: Secondary | ICD-10-CM | POA: Diagnosis not present

## 2021-06-22 DIAGNOSIS — M8580 Other specified disorders of bone density and structure, unspecified site: Secondary | ICD-10-CM | POA: Diagnosis not present

## 2021-06-22 DIAGNOSIS — R82998 Other abnormal findings in urine: Secondary | ICD-10-CM | POA: Diagnosis not present

## 2021-06-22 DIAGNOSIS — J309 Allergic rhinitis, unspecified: Secondary | ICD-10-CM | POA: Diagnosis not present

## 2021-06-22 DIAGNOSIS — Z0000 Encounter for general adult medical examination without abnormal findings: Secondary | ICD-10-CM | POA: Diagnosis not present

## 2021-06-22 DIAGNOSIS — Z86018 Personal history of other benign neoplasm: Secondary | ICD-10-CM | POA: Diagnosis not present

## 2021-06-22 DIAGNOSIS — Z23 Encounter for immunization: Secondary | ICD-10-CM | POA: Diagnosis not present

## 2021-06-22 DIAGNOSIS — E785 Hyperlipidemia, unspecified: Secondary | ICD-10-CM | POA: Diagnosis not present

## 2021-06-22 DIAGNOSIS — M25562 Pain in left knee: Secondary | ICD-10-CM | POA: Diagnosis not present

## 2021-06-22 DIAGNOSIS — Z1389 Encounter for screening for other disorder: Secondary | ICD-10-CM | POA: Diagnosis not present

## 2021-06-22 DIAGNOSIS — Z1331 Encounter for screening for depression: Secondary | ICD-10-CM | POA: Diagnosis not present

## 2021-06-22 DIAGNOSIS — R69 Illness, unspecified: Secondary | ICD-10-CM | POA: Diagnosis not present

## 2021-06-22 DIAGNOSIS — R030 Elevated blood-pressure reading, without diagnosis of hypertension: Secondary | ICD-10-CM | POA: Diagnosis not present

## 2021-06-27 DIAGNOSIS — D443 Neoplasm of uncertain behavior of pituitary gland: Secondary | ICD-10-CM | POA: Diagnosis not present

## 2021-06-27 DIAGNOSIS — H40013 Open angle with borderline findings, low risk, bilateral: Secondary | ICD-10-CM | POA: Diagnosis not present

## 2021-06-29 ENCOUNTER — Ambulatory Visit
Admission: RE | Admit: 2021-06-29 | Discharge: 2021-06-29 | Disposition: A | Discharge status: Home / Self Care | Payer: Medicare HMO | Source: Ambulatory Visit | Attending: Internal Medicine | Admitting: Internal Medicine

## 2021-06-29 DIAGNOSIS — Z1231 Encounter for screening mammogram for malignant neoplasm of breast: Secondary | ICD-10-CM | POA: Diagnosis not present

## 2021-08-10 DIAGNOSIS — R42 Dizziness and giddiness: Secondary | ICD-10-CM | POA: Diagnosis not present

## 2021-08-10 DIAGNOSIS — H8111 Benign paroxysmal vertigo, right ear: Secondary | ICD-10-CM | POA: Diagnosis not present

## 2022-01-01 DIAGNOSIS — H35362 Drusen (degenerative) of macula, left eye: Secondary | ICD-10-CM | POA: Diagnosis not present

## 2022-01-01 DIAGNOSIS — D443 Neoplasm of uncertain behavior of pituitary gland: Secondary | ICD-10-CM | POA: Diagnosis not present

## 2022-01-01 DIAGNOSIS — H1045 Other chronic allergic conjunctivitis: Secondary | ICD-10-CM | POA: Diagnosis not present

## 2022-01-01 DIAGNOSIS — H40013 Open angle with borderline findings, low risk, bilateral: Secondary | ICD-10-CM | POA: Diagnosis not present

## 2022-01-10 DIAGNOSIS — S90221A Contusion of right lesser toe(s) with damage to nail, initial encounter: Secondary | ICD-10-CM | POA: Diagnosis not present

## 2022-02-17 IMAGING — MG DIGITAL SCREENING BILAT W/ TOMO W/ CAD
8 series · 8 of 24 positions shown · non-contrast
Comparison: Previous exam(s).

CLINICAL DATA: Screening.

EXAM:
DIGITAL SCREENING BILATERAL MAMMOGRAM WITH TOMO AND CAD

[L MLO synth-2D]
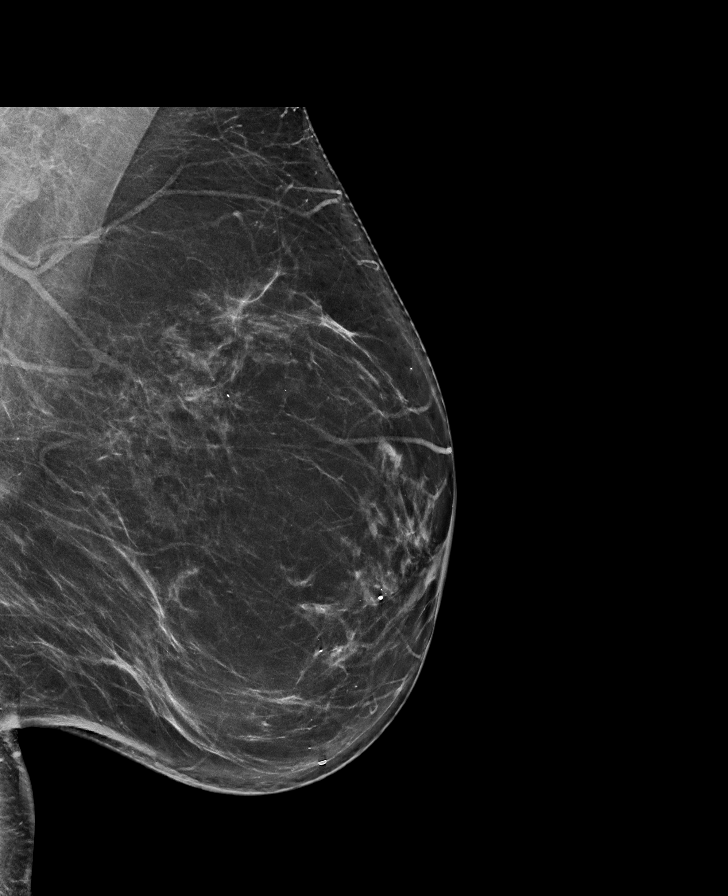

[R MLO synth-2D]
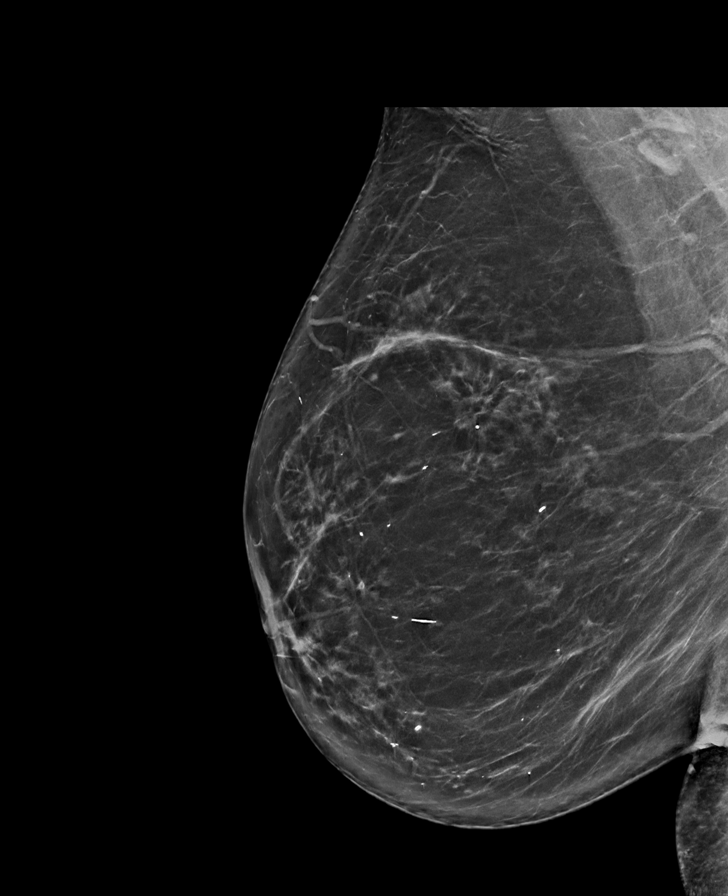

[L CC synth-2D]
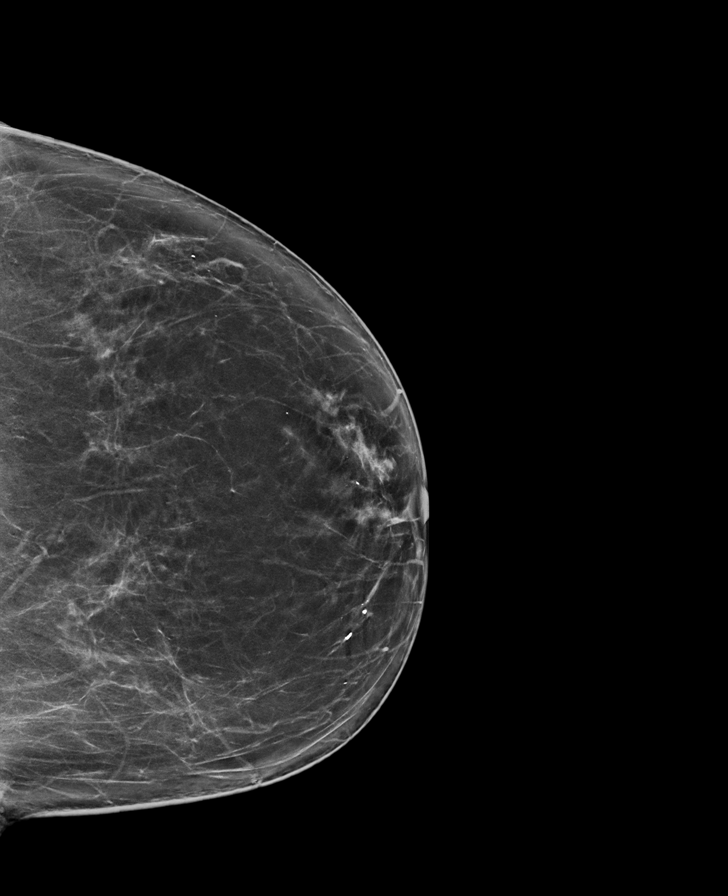

[R CC synth-2D]
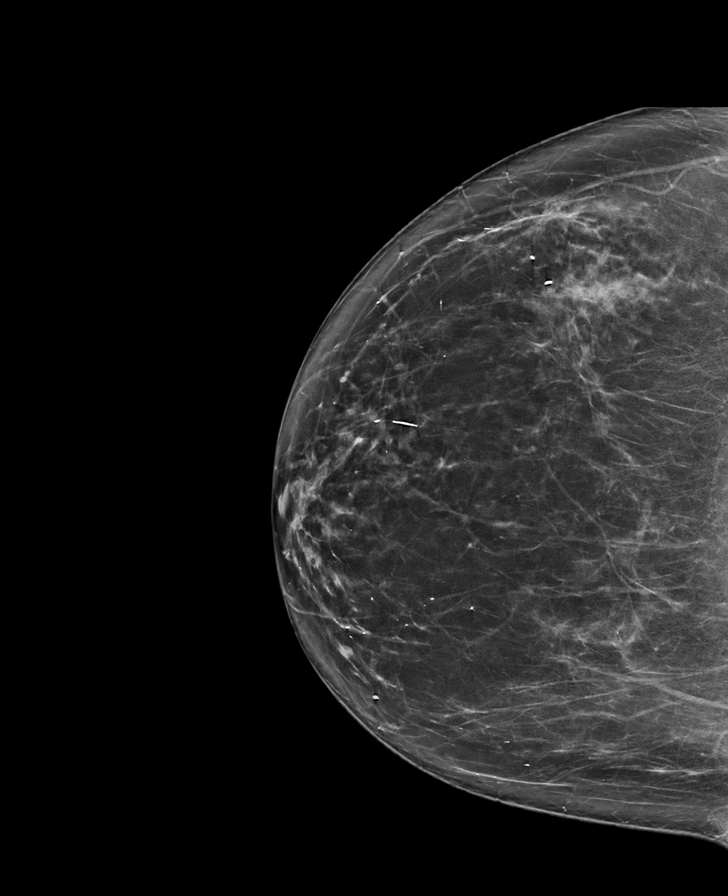

[R CC tomo · tomo slice 41/80.0]
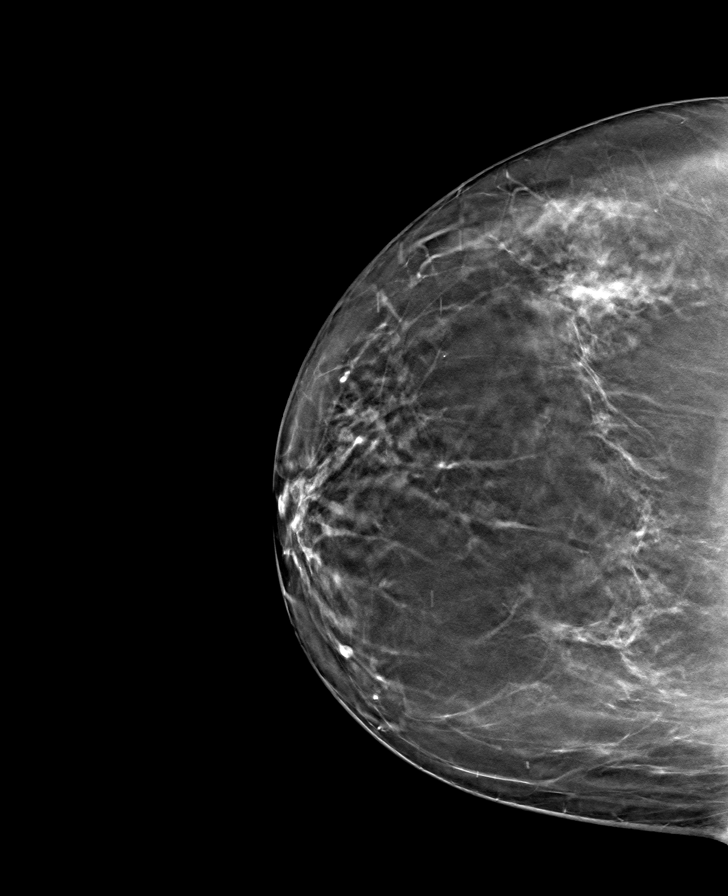

[L CC tomo · tomo slice 41/82.0]
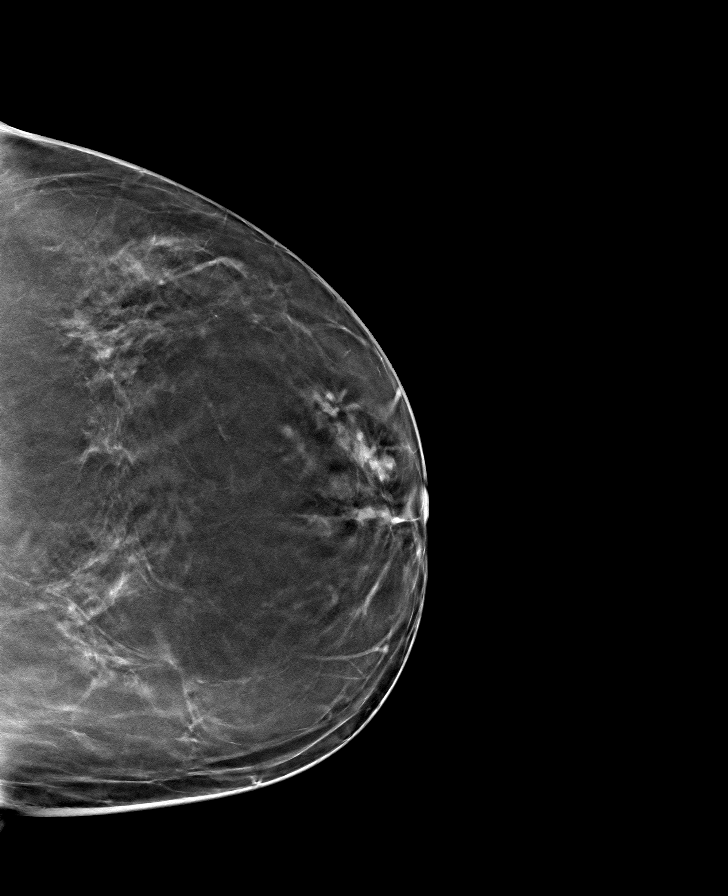

[L MLO tomo · tomo slice 43/85.0]
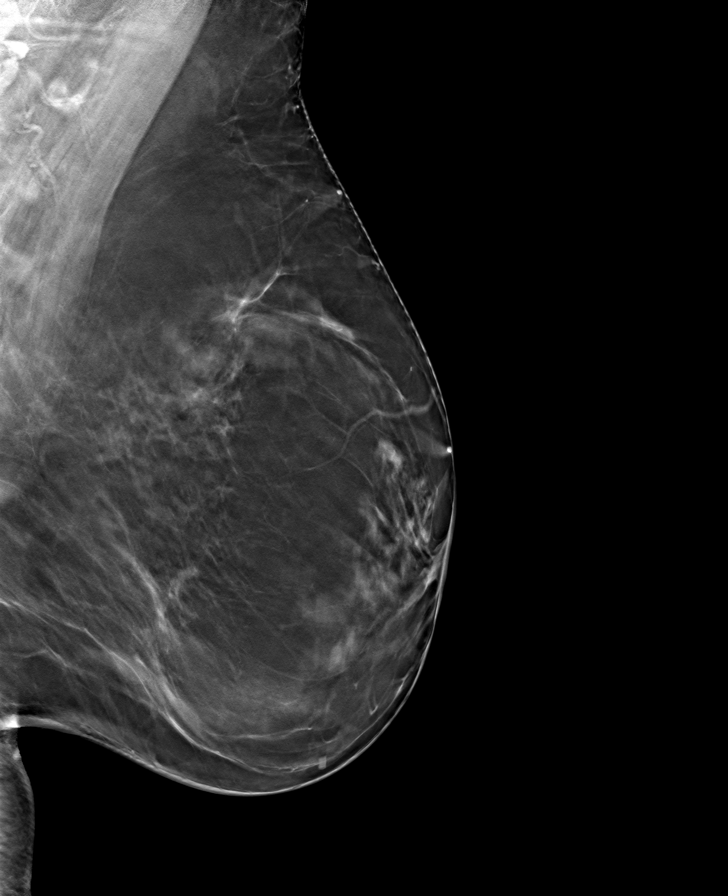

[R MLO tomo · tomo slice 45/88.0]
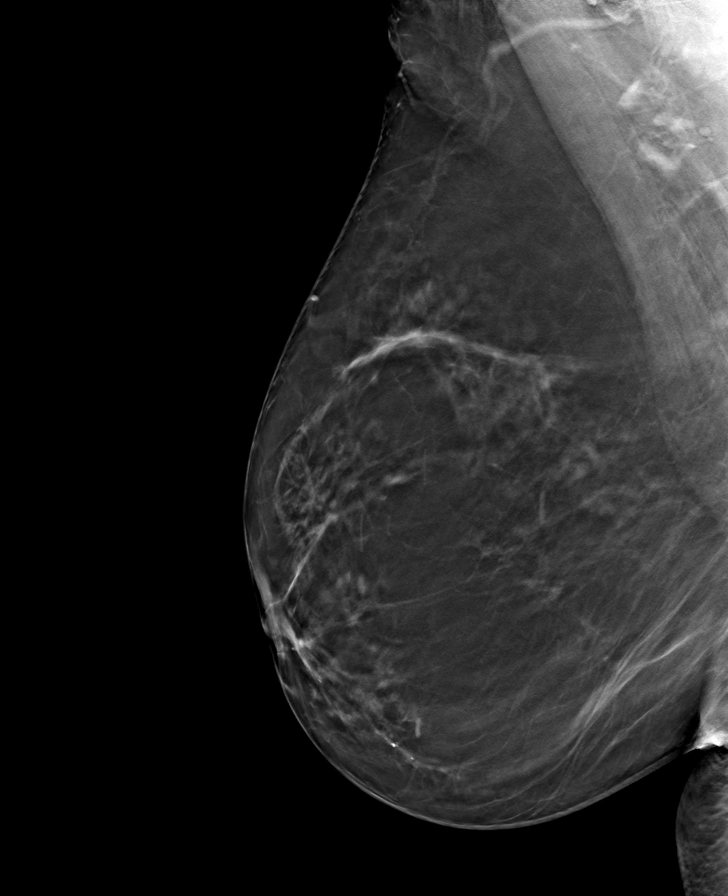

[8 of 24 positions shown; findings below may reference images not displayed]

ACR Breast Density Category b: There are scattered areas of
fibroglandular density.
FINDINGS: There are no findings suspicious for malignancy. Images were
processed with CAD.
IMPRESSION: No mammographic evidence of malignancy. A result letter of this
screening mammogram will be mailed directly to the patient.

RECOMMENDATION:
Screening mammogram in one year. (Code:[id number])

BI-RADS CATEGORY  1: Negative.

## 2022-05-28 ENCOUNTER — Other Ambulatory Visit: Payer: Self-pay | Admitting: Internal Medicine

## 2022-05-28 DIAGNOSIS — Z1231 Encounter for screening mammogram for malignant neoplasm of breast: Secondary | ICD-10-CM

## 2022-06-19 DIAGNOSIS — E785 Hyperlipidemia, unspecified: Secondary | ICD-10-CM | POA: Diagnosis not present

## 2022-06-19 DIAGNOSIS — M8580 Other specified disorders of bone density and structure, unspecified site: Secondary | ICD-10-CM | POA: Diagnosis not present

## 2022-06-19 DIAGNOSIS — R69 Illness, unspecified: Secondary | ICD-10-CM | POA: Diagnosis not present

## 2022-06-19 DIAGNOSIS — R7989 Other specified abnormal findings of blood chemistry: Secondary | ICD-10-CM | POA: Diagnosis not present

## 2022-06-20 DIAGNOSIS — E221 Hyperprolactinemia: Secondary | ICD-10-CM | POA: Diagnosis not present

## 2022-06-26 DIAGNOSIS — Z1389 Encounter for screening for other disorder: Secondary | ICD-10-CM | POA: Diagnosis not present

## 2022-06-26 DIAGNOSIS — Z86018 Personal history of other benign neoplasm: Secondary | ICD-10-CM | POA: Diagnosis not present

## 2022-06-26 DIAGNOSIS — F419 Anxiety disorder, unspecified: Secondary | ICD-10-CM | POA: Diagnosis not present

## 2022-06-26 DIAGNOSIS — H8110 Benign paroxysmal vertigo, unspecified ear: Secondary | ICD-10-CM | POA: Diagnosis not present

## 2022-06-26 DIAGNOSIS — Z86010 Personal history of colonic polyps: Secondary | ICD-10-CM | POA: Diagnosis not present

## 2022-06-26 DIAGNOSIS — M222X2 Patellofemoral disorders, left knee: Secondary | ICD-10-CM | POA: Diagnosis not present

## 2022-06-26 DIAGNOSIS — R82998 Other abnormal findings in urine: Secondary | ICD-10-CM | POA: Diagnosis not present

## 2022-06-26 DIAGNOSIS — Z1331 Encounter for screening for depression: Secondary | ICD-10-CM | POA: Diagnosis not present

## 2022-06-26 DIAGNOSIS — R69 Illness, unspecified: Secondary | ICD-10-CM | POA: Diagnosis not present

## 2022-06-26 DIAGNOSIS — M222X1 Patellofemoral disorders, right knee: Secondary | ICD-10-CM | POA: Diagnosis not present

## 2022-06-26 DIAGNOSIS — R030 Elevated blood-pressure reading, without diagnosis of hypertension: Secondary | ICD-10-CM | POA: Diagnosis not present

## 2022-06-26 DIAGNOSIS — E785 Hyperlipidemia, unspecified: Secondary | ICD-10-CM | POA: Diagnosis not present

## 2022-06-26 DIAGNOSIS — M8580 Other specified disorders of bone density and structure, unspecified site: Secondary | ICD-10-CM | POA: Diagnosis not present

## 2022-06-26 DIAGNOSIS — Z0000 Encounter for general adult medical examination without abnormal findings: Secondary | ICD-10-CM | POA: Diagnosis not present

## 2022-07-02 ENCOUNTER — Ambulatory Visit
Admission: RE | Admit: 2022-07-02 | Discharge: 2022-07-02 | Disposition: A | Discharge status: Home / Self Care | Payer: Medicare HMO | Source: Ambulatory Visit | Attending: Internal Medicine | Admitting: Internal Medicine

## 2022-07-02 DIAGNOSIS — Z1231 Encounter for screening mammogram for malignant neoplasm of breast: Secondary | ICD-10-CM | POA: Diagnosis not present

## 2022-07-02 DIAGNOSIS — D443 Neoplasm of uncertain behavior of pituitary gland: Secondary | ICD-10-CM | POA: Diagnosis not present

## 2022-07-02 DIAGNOSIS — H40013 Open angle with borderline findings, low risk, bilateral: Secondary | ICD-10-CM | POA: Diagnosis not present

## 2022-08-23 ENCOUNTER — Encounter: Payer: Self-pay | Admitting: Podiatry

## 2022-08-23 ENCOUNTER — Ambulatory Visit: Payer: Medicare HMO

## 2022-08-23 ENCOUNTER — Ambulatory Visit: Payer: Medicare HMO | Admitting: Podiatry

## 2022-08-23 DIAGNOSIS — S90229A Contusion of unspecified lesser toe(s) with damage to nail, initial encounter: Secondary | ICD-10-CM

## 2022-08-23 DIAGNOSIS — M2040 Other hammer toe(s) (acquired), unspecified foot: Secondary | ICD-10-CM

## 2022-08-26 NOTE — Progress Notes (Signed)
  Subjective:  Patient ID: Erin Gregory, female    DOB: 01/29/1951,  MRN: [id number] HPI Chief Complaint  Patient presents with   Nail Problem    2nd toe right - hammertoe x years, exercises a lot and now the nail is dark, concerned that the toenail is cancerous, wanted checked, no pain, cushions the tip of the toe   New Patient (Initial Visit)    71 y.o. female presents with the above complaint.   ROS: Denies fever chills nausea vomiting muscle aches pains calf pain back pain chest pain shortness of breath.  Past Medical History:  Diagnosis Date   Cataract    right eye   Hyperlipidemia    Past Surgical History:  Procedure Laterality Date   CATARACT EXTRACTION  04/2011   left   pituitary gland tumor  04/10/2012    Current Outpatient Medications:    atorvastatin (LIPITOR) 40 MG tablet, Take 40 mg by mouth daily., Disp: , Rfl:    Calcium Carbonate (CALTRATE 600 PO), Take 2 tablets by mouth daily., Disp: , Rfl:    Cholecalciferol (VITAMIN D3) 2000 UNITS TABS, Take by mouth daily., Disp: , Rfl:    Inulin (FIBERCHOICE PO), Take 2 tablets by mouth daily., Disp: , Rfl:    Multiple Vitamin (MULTIVITAMIN) tablet, Take 1 tablet by mouth daily., Disp: , Rfl:   No Known Allergies Review of Systems Objective:  There were no vitals filed for this visit.  General: Well developed, nourished, in no acute distress, alert and oriented x3   Dermatological: Skin is warm, dry and supple bilateral. Nails x 10 are well maintained; remaining integument appears unremarkable at this time. There are no open sores, no preulcerative lesions, no rash or signs of infection present.  Nail dystrophy second digit with some mild ungual hematoma proximal nail fold does not demonstrate any dystrophic changes.  Vascular: Dorsalis Pedis artery and Posterior Tibial artery pedal pulses are 2/4 bilateral with immedate capillary fill time. Pedal hair growth present. No varicosities and no lower extremity edema  present bilateral.   Neruologic: Grossly intact via light touch bilateral. Vibratory intact via tuning fork bilateral. Protective threshold with Semmes Wienstein monofilament intact to all pedal sites bilateral. Patellar and Achilles deep tendon reflexes 2+ bilateral. No Babinski or clonus noted bilateral.   Musculoskeletal: No gross boney pedal deformities bilateral. No pain, crepitus, or limitation noted with foot and ankle range of motion bilateral. Muscular strength 5/5 in all groups tested bilateral.  Multiple hammertoe deformities noted bilateral.  Gait: Unassisted, Nonantalgic.    Radiographs:  None taken  Assessment & Plan:   Assessment: Subungual hematoma nail dystrophy hammertoe deformity  Plan: Instructed her to keep the nails cut short and debrided follow-up with her on an as-needed basis.     Erin Gregory T. [place], [place]

## 2023-03-14 DIAGNOSIS — Z961 Presence of intraocular lens: Secondary | ICD-10-CM | POA: Diagnosis not present

## 2023-03-14 DIAGNOSIS — H40023 Open angle with borderline findings, high risk, bilateral: Secondary | ICD-10-CM | POA: Diagnosis not present

## 2023-03-14 DIAGNOSIS — H26492 Other secondary cataract, left eye: Secondary | ICD-10-CM | POA: Diagnosis not present

## 2023-03-14 DIAGNOSIS — H47293 Other optic atrophy, bilateral: Secondary | ICD-10-CM | POA: Diagnosis not present

## 2023-04-10 ENCOUNTER — Encounter: Payer: Self-pay | Admitting: Internal Medicine

## 2023-04-30 ENCOUNTER — Encounter: Payer: Self-pay | Admitting: Internal Medicine

## 2023-05-21 ENCOUNTER — Other Ambulatory Visit: Payer: Self-pay | Admitting: Internal Medicine

## 2023-05-21 DIAGNOSIS — Z1231 Encounter for screening mammogram for malignant neoplasm of breast: Secondary | ICD-10-CM

## 2023-05-22 ENCOUNTER — Ambulatory Visit (AMBULATORY_SURGERY_CENTER): Payer: Medicare HMO

## 2023-05-22 ENCOUNTER — Encounter: Payer: Self-pay | Admitting: Internal Medicine

## 2023-05-22 VITALS — Ht 67.0 in | Wt 156.0 lb

## 2023-05-22 DIAGNOSIS — H47293 Other optic atrophy, bilateral: Secondary | ICD-10-CM | POA: Diagnosis not present

## 2023-05-22 DIAGNOSIS — Z800 Family history of malignant neoplasm of digestive organs: Secondary | ICD-10-CM

## 2023-05-22 DIAGNOSIS — H40023 Open angle with borderline findings, high risk, bilateral: Secondary | ICD-10-CM | POA: Diagnosis not present

## 2023-05-22 MED ORDER — NA SULFATE-K SULFATE-MG SULF 17.5-3.13-1.6 GM/177ML PO SOLN: 1.0000 | Freq: Once | ORAL | 0 refills | Status: AC

## 2023-05-22 NOTE — Progress Notes (Signed)
Pre visit completed via phone call; Patient verified name, DOB, and address;  No egg or soy allergy known to patient;  No issues known to pt with past sedation with any surgeries or procedures; Patient denies ever being told they had issues or difficulty with intubation;  No FH of Malignant Hyperthermia; Pt is not on diet pills; Pt is not on home 02;  Pt is not on blood thinners; Pt denies issues with constipation;  No A fib or A flutter;  Have any cardiac testing pending--NO Insurance verified during PV appt--- [employer] Medicare  Pt can ambulate without assistance;  Pt denies use of chewing tobacco Discussed diabetic/weight loss medication holds; Discussed NSAID holds; Checked BMI to be less than 50; Pt instructed to use [employer] or GoodRx for a price reduction on prep  Patient's chart reviewed by Cathlyn Parsons CNRA prior to previsit and patient appropriate for the [facility].  Pre visit completed and red dot placed by patient's name on their procedure day (on provider's schedule).    Instructions sent to patient via MyChart per her request;

## 2023-06-03 ENCOUNTER — Encounter: Payer: Self-pay | Admitting: Internal Medicine

## 2023-06-03 ENCOUNTER — Ambulatory Visit: Payer: Medicare HMO | Admitting: Internal Medicine

## 2023-06-03 VITALS — BP 139/93 | HR 62 | Temp 97.8°F | Resp 16 | Ht 67.0 in | Wt 156.0 lb

## 2023-06-03 DIAGNOSIS — Z800 Family history of malignant neoplasm of digestive organs: Secondary | ICD-10-CM | POA: Diagnosis not present

## 2023-06-03 DIAGNOSIS — E785 Hyperlipidemia, unspecified: Secondary | ICD-10-CM | POA: Diagnosis not present

## 2023-06-03 DIAGNOSIS — Z09 Encounter for follow-up examination after completed treatment for conditions other than malignant neoplasm: Secondary | ICD-10-CM

## 2023-06-03 DIAGNOSIS — Z86010 Personal history of colonic polyps: Secondary | ICD-10-CM | POA: Diagnosis not present

## 2023-06-03 DIAGNOSIS — D123 Benign neoplasm of transverse colon: Secondary | ICD-10-CM | POA: Diagnosis not present

## 2023-06-03 MED ORDER — SODIUM CHLORIDE 0.9 % IV SOLN: 500.0000 mL | Freq: Once | INTRAVENOUS | Status: DC

## 2023-06-03 NOTE — Patient Instructions (Signed)
-  Handout on polyps, diverticulosis and hemorrhoids  provided -await pathology results -repeat colonoscopy in 5 years for surveillance recommended.  -Continue present medications   YOU HAD AN ENDOSCOPIC PROCEDURE TODAY AT [place]:   Refer to the procedure report that was given to you for any specific questions about what was found during the examination.  If the procedure report does not answer your questions, please call your gastroenterologist to clarify.  If you requested that your care partner not be given the details of your procedure findings, then the procedure report has been included in a sealed envelope for you to review at your convenience later.  YOU SHOULD EXPECT: Some feelings of bloating in the abdomen. Passage of more gas than usual.  Walking can help get rid of the air that was put into your GI tract during the procedure and reduce the bloating. If you had a lower endoscopy (such as a colonoscopy or flexible sigmoidoscopy) you may notice spotting of blood in your stool or on the toilet paper. If you underwent a bowel prep for your procedure, you may not have a normal bowel movement for a few days.  Please Note:  You might notice some irritation and congestion in your nose or some drainage.  This is from the oxygen used during your procedure.  There is no need for concern and it should clear up in a day or so.  SYMPTOMS TO REPORT IMMEDIATELY:  Following lower endoscopy (colonoscopy or flexible sigmoidoscopy):  Excessive amounts of blood in the stool  Significant tenderness or worsening of abdominal pains  Swelling of the abdomen that is new, acute  Fever of 100F or higher  For urgent or emergent issues, a gastroenterologist can be reached at any hour by calling [phone]. Do not use MyChart messaging for urgent concerns.    DIET:  We do recommend a small meal at first, but then you may proceed to your regular diet.  Drink plenty of fluids but you  should avoid alcoholic beverages for 24 hours.  ACTIVITY:  You should plan to take it easy for the rest of today and you should NOT DRIVE or use heavy machinery until tomorrow (because of the sedation medicines used during the test).    FOLLOW UP: Our staff will call the number listed on your records the next business day following your procedure.  We will call around 7:15- 8:00 am to check on you and address any questions or concerns that you may have regarding the information given to you following your procedure. If we do not reach you, we will leave a message.     If any biopsies were taken you will be contacted by phone or by letter within the next 1-3 weeks.  Please call us at [phone] if you have not heard about the biopsies in 3 weeks.    SIGNATURES/CONFIDENTIALITY: You and/or your care partner have signed paperwork which will be entered into your electronic medical record.  These signatures attest to the fact that that the information above on your After Visit Summary has been reviewed and is understood.  Full responsibility of the confidentiality of this discharge information lies with you and/or your care-partner.

## 2023-06-03 NOTE — Op Note (Signed)
[facility] Patient Name: Erin Gregory Procedure Date: 06/03/2023 2:24 PM MRN: [id number] Endoscopist: Wilhemina Bonito. Marina Goodell , MD, [id number] Age: 72 Referring MD:  Date of Birth: 1951-03-10 Gender: Female Account #: [id number] Procedure:                Colonoscopy with cold snare polypectomy x 1 Indications:              High risk colon cancer surveillance: Personal                            history of multiple (3 or more) adenomas. Previous                            examinations 2009, 2014, 2019 Medicines:                Monitored Anesthesia Care Procedure:                Pre-Anesthesia Assessment:                           - Prior to the procedure, a History and Physical                            was performed, and patient medications and                            allergies were reviewed. The patient's tolerance of                            previous anesthesia was also reviewed. The risks                            and benefits of the procedure and the sedation                            options and risks were discussed with the patient.                            All questions were answered, and informed consent                            was obtained. Prior Anticoagulants: The patient has                            taken no anticoagulant or antiplatelet agents. ASA                            Grade Assessment: II - A patient with mild systemic                            disease. After reviewing the risks and benefits,                            the patient was deemed in satisfactory condition to  undergo the procedure.                           After obtaining informed consent, the colonoscope                            was passed under direct vision. Throughout the                            procedure, the patient's blood pressure, pulse, and                            oxygen saturations were monitored continuously. The                             CF [id number] #[id number] was introduced through the anus                            and advanced to the the cecum, identified by                            appendiceal orifice and ileocecal valve. The                            ileocecal valve, appendiceal orifice, and rectum                            were photographed. The quality of the bowel                            preparation was excellent. The colonoscopy was                            performed without difficulty. The patient tolerated                            the procedure well. The bowel preparation used was                            SUPREP via split dose instruction. Scope In: 2:31:16 PM Scope Out: 2:41:22 PM Scope Withdrawal Time: 0 hours 8 minutes 9 seconds  Total Procedure Duration: 0 hours 10 minutes 6 seconds  Findings:                 A 3 mm polyp was found in the transverse colon. The                            polyp was removed with a cold snare. Resection and                            retrieval were complete.                           Multiple diverticula were found in the left colon  and right colon.                           Internal hemorrhoids were found during                            retroflexion. The hemorrhoids were small.                           The exam was otherwise without abnormality on                            direct and retroflexion views. Complications:            No immediate complications. Estimated blood loss:                            None. Estimated Blood Loss:     Estimated blood loss: none. Impression:               - One 3 mm polyp in the transverse colon, removed                            with a cold snare. Resected and retrieved.                           - Diverticulosis in the left colon and in the right                            colon.                           - Internal hemorrhoids.                           - The examination was otherwise normal on  direct                            and retroflexion views. Recommendation:           - Repeat colonoscopy in 5 years for surveillance                            (personal history of multiple adenomatous polyps).                           - Patient has a contact number available for                            emergencies. The signs and symptoms of potential                            delayed complications were discussed with the                            patient. Return to normal activities tomorrow.  Written discharge instructions were provided to the                            patient.                           - Resume previous diet.                           - Continue present medications.                           - Await pathology results. Wilhemina Bonito. Marina Goodell, MD 06/03/2023 2:46:16 PM This report has been signed electronically.

## 2023-06-03 NOTE — Progress Notes (Signed)
Pt's states no medical or surgical changes since previsit or office visit. 

## 2023-06-03 NOTE — Progress Notes (Signed)
HISTORY OF PRESENT ILLNESS:  Erin Gregory is a 72 y.o. female with a history of multiple adenomatous colon polyps.  Presents today for surveillance colonoscopy.  Previous examinations 2009, 2014, 2019  REVIEW OF SYSTEMS:  All non-GI ROS negative except for  Past Medical History:  Diagnosis Date   Cataract    bilateral sx completed   Hyperlipidemia    on meds    Past Surgical History:  Procedure Laterality Date   CATARACT EXTRACTION Bilateral    COLONOSCOPY  2019   JP-MAC-suyprep(exc)-tics/hems/TA x -5 yr recall   pituitary gland tumor  04/10/2012   removed    Social History Erin Gregory  reports that she has never smoked. She has never used smokeless tobacco. She reports that she does not drink alcohol and does not use drugs.  family history includes Colon polyps (age of onset: 41) in her sister and sister; Diabetes in her mother and sister; Hypertension in her sister; Peptic Ulcer Disease in her father; Sarcoidosis in her sister; Stomach cancer (age of onset: 50) in her father.  No Known Allergies     PHYSICAL EXAMINATION: Vital signs: BP (!) 170/77   Pulse 78   Temp 97.8 F (36.6 C) (Temporal)   Ht 5\' 7"  (1.702 m)   Wt 156 lb (70.8 kg)   SpO2 99%   BMI 24.43 kg/m  General: Well-developed, well-nourished, no acute distress HEENT: Sclerae are anicteric, conjunctiva pink. Oral mucosa intact Lungs: Clear Heart: Regular Abdomen: soft, nontender, nondistended, no obvious ascites, no peritoneal signs, normal bowel sounds. No organomegaly. Extremities: No edema Psychiatric: alert and oriented x3. Cooperative     ASSESSMENT:  Personal history of multiple   PLAN:  Surveillance colonoscopy

## 2023-06-03 NOTE — Progress Notes (Signed)
Called to room to assist during endoscopic procedure.  Patient ID and intended procedure confirmed with present staff. Received instructions for my participation in the procedure from the performing physician.  

## 2023-06-03 NOTE — Progress Notes (Signed)
Pt resting comfortably. VSS. Airway intact. SBAR complete to RN. All questions answered.   

## 2023-06-04 ENCOUNTER — Telehealth: Payer: Self-pay | Admitting: *Deleted

## 2023-06-04 NOTE — Telephone Encounter (Signed)
  Follow up Call-     06/03/2023    2:01 PM  Call back number  Post procedure Call Back phone  # [phone]  Permission to leave phone message Yes     Patient questions:  Do you have a fever, pain , or abdominal swelling? No. Pain Score  0 *  Have you tolerated food without any problems? Yes.    Have you been able to return to your normal activities? Yes.    Do you have any questions about your discharge instructions: Diet   Yes.   Medications  Yes.   Follow up visit  Yes.    Do you have questions or concerns about your Care? No.  Actions: * If pain score is 4 or above: No action needed, pain <4.

## 2023-06-06 ENCOUNTER — Encounter: Payer: Self-pay | Admitting: Internal Medicine

## 2023-06-21 DIAGNOSIS — E221 Hyperprolactinemia: Secondary | ICD-10-CM | POA: Diagnosis not present

## 2023-06-21 DIAGNOSIS — E785 Hyperlipidemia, unspecified: Secondary | ICD-10-CM | POA: Diagnosis not present

## 2023-07-01 DIAGNOSIS — R82998 Other abnormal findings in urine: Secondary | ICD-10-CM | POA: Diagnosis not present

## 2023-07-04 ENCOUNTER — Ambulatory Visit
Admission: RE | Admit: 2023-07-04 | Discharge: 2023-07-04 | Disposition: A | Discharge status: Home / Self Care | Payer: Medicare HMO | Source: Ambulatory Visit | Attending: Internal Medicine | Admitting: Internal Medicine

## 2023-07-04 DIAGNOSIS — Z1231 Encounter for screening mammogram for malignant neoplasm of breast: Secondary | ICD-10-CM

## 2023-07-30 DIAGNOSIS — G8929 Other chronic pain: Secondary | ICD-10-CM | POA: Diagnosis not present

## 2023-07-30 DIAGNOSIS — R0789 Other chest pain: Secondary | ICD-10-CM | POA: Diagnosis not present

## 2023-07-30 DIAGNOSIS — M25511 Pain in right shoulder: Secondary | ICD-10-CM | POA: Diagnosis not present

## 2023-07-30 DIAGNOSIS — M940 Chondrocostal junction syndrome [Tietze]: Secondary | ICD-10-CM | POA: Diagnosis not present

## 2024-04-27 DIAGNOSIS — L299 Pruritus, unspecified: Secondary | ICD-10-CM | POA: Diagnosis not present

## 2024-04-27 DIAGNOSIS — R21 Rash and other nonspecific skin eruption: Secondary | ICD-10-CM | POA: Diagnosis not present

## 2024-04-27 DIAGNOSIS — B029 Zoster without complications: Secondary | ICD-10-CM | POA: Diagnosis not present

## 2024-05-22 ENCOUNTER — Other Ambulatory Visit: Payer: Self-pay | Admitting: Internal Medicine

## 2024-05-22 DIAGNOSIS — H47293 Other optic atrophy, bilateral: Secondary | ICD-10-CM | POA: Diagnosis not present

## 2024-05-22 DIAGNOSIS — H40023 Open angle with borderline findings, high risk, bilateral: Secondary | ICD-10-CM | POA: Diagnosis not present

## 2024-05-22 DIAGNOSIS — Z1231 Encounter for screening mammogram for malignant neoplasm of breast: Secondary | ICD-10-CM

## 2024-05-22 DIAGNOSIS — H26492 Other secondary cataract, left eye: Secondary | ICD-10-CM | POA: Diagnosis not present

## 2024-07-08 ENCOUNTER — Ambulatory Visit
Admission: RE | Admit: 2024-07-08 | Discharge: 2024-07-08 | Disposition: A | Discharge status: Home / Self Care | Source: Ambulatory Visit | Attending: Internal Medicine | Admitting: Internal Medicine

## 2024-07-08 DIAGNOSIS — Z1231 Encounter for screening mammogram for malignant neoplasm of breast: Secondary | ICD-10-CM | POA: Diagnosis not present

## 2024-07-10 ENCOUNTER — Other Ambulatory Visit: Payer: Self-pay | Admitting: Internal Medicine

## 2024-07-10 DIAGNOSIS — R928 Other abnormal and inconclusive findings on diagnostic imaging of breast: Secondary | ICD-10-CM

## 2024-07-15 ENCOUNTER — Ambulatory Visit
Admission: RE | Admit: 2024-07-15 | Discharge: 2024-07-15 | Disposition: A | Source: Ambulatory Visit | Attending: Internal Medicine | Admitting: Internal Medicine

## 2024-07-15 DIAGNOSIS — R928 Other abnormal and inconclusive findings on diagnostic imaging of breast: Secondary | ICD-10-CM

## 2024-07-16 ENCOUNTER — Encounter: Payer: Self-pay | Admitting: Internal Medicine

## 2024-07-27 DIAGNOSIS — R030 Elevated blood-pressure reading, without diagnosis of hypertension: Secondary | ICD-10-CM | POA: Diagnosis not present

## 2024-07-27 DIAGNOSIS — Z79899 Other long term (current) drug therapy: Secondary | ICD-10-CM | POA: Diagnosis not present

## 2024-07-27 DIAGNOSIS — E785 Hyperlipidemia, unspecified: Secondary | ICD-10-CM | POA: Diagnosis not present

## 2024-07-29 DIAGNOSIS — R82998 Other abnormal findings in urine: Secondary | ICD-10-CM | POA: Diagnosis not present
